# Patient Record
Sex: Female | Born: 2005 | Hispanic: Yes | Marital: Single | Smoking: Never smoker
Health system: Southern US, Community
[De-identification: ages and names within clinical notes are randomized; demographics above are authoritative.]

---
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
## 2024-02-20 ENCOUNTER — Emergency Department
Admission: EM | Admit: 2024-02-20 | Discharge: 2024-02-20 | Disposition: A | Discharge status: Home / Self Care | Attending: Emergency Medicine | Admitting: Emergency Medicine

## 2024-02-20 ENCOUNTER — Other Ambulatory Visit: Payer: Self-pay

## 2024-02-20 ENCOUNTER — Emergency Department

## 2024-02-20 DIAGNOSIS — K625 Hemorrhage of anus and rectum: Secondary | ICD-10-CM | POA: Insufficient documentation

## 2024-02-20 DIAGNOSIS — R1084 Generalized abdominal pain: Secondary | ICD-10-CM | POA: Diagnosis present

## 2024-02-20 LAB — COMPREHENSIVE METABOLIC PANEL WITH GFR
ALT: 22 U/L (ref 0–44)
AST: 20 U/L (ref 15–41)
Albumin: 4.4 g/dL (ref 3.5–5.0)
Alkaline Phosphatase: 55 U/L (ref 38–126)
Anion gap: 12 (ref 5–15)
BUN: 11 mg/dL (ref 6–20)
CO2: 21 mmol/L — ABNORMAL LOW (ref 22–32)
Calcium: 9.2 mg/dL (ref 8.9–10.3)
Chloride: 105 mmol/L (ref 98–111)
Creatinine, Ser: 0.75 mg/dL (ref 0.44–1.00)
GFR, Estimated: >60 mL/min (ref >60)
Glucose, Bld: 101 mg/dL — ABNORMAL HIGH (ref 70–99)
Potassium: 3.4 mmol/L — ABNORMAL LOW (ref 3.5–5.1)
Sodium: 138 mmol/L (ref 135–145)
Total Bilirubin: 0.9 mg/dL (ref 0.0–1.2)
Total Protein: 7.8 g/dL (ref 6.5–8.1)

## 2024-02-20 LAB — CBC
HCT: 34.2 % — ABNORMAL LOW (ref 36.0–46.0)
Hemoglobin: 11.3 g/dL — ABNORMAL LOW (ref 12.0–15.0)
MCH: 29.8 pg (ref 26.0–34.0)
MCHC: 33 g/dL (ref 30.0–36.0)
MCV: 90.2 fL (ref 80.0–100.0)
Platelets: 450 10*3/uL — ABNORMAL HIGH (ref 150–400)
RBC: 3.79 MIL/uL — ABNORMAL LOW (ref 3.87–5.11)
RDW: 14.4 % (ref 11.5–15.5)
WBC: 6.7 10*3/uL (ref 4.0–10.5)
nRBC: 0 % (ref 0.0–0.2)

## 2024-02-20 LAB — LIPASE, BLOOD: Lipase: 26 U/L (ref 11–51)

## 2024-02-20 LAB — URINALYSIS, ROUTINE W REFLEX MICROSCOPIC
Bilirubin Urine: NEGATIVE
Glucose, UA: NEGATIVE mg/dL
Ketones, ur: 20 mg/dL — AB
Leukocytes,Ua: NEGATIVE
Nitrite: NEGATIVE
Protein, ur: 30 mg/dL — AB
Specific Gravity, Urine: 1.03 (ref 1.005–1.030)
pH: 5 (ref 5.0–8.0)

## 2024-02-20 LAB — SAMPLE TO BLOOD BANK

## 2024-02-20 LAB — PREGNANCY, URINE: Preg Test, Ur: NEGATIVE

## 2024-02-20 MED ORDER — IOHEXOL 300 MG/ML  SOLN
80.0000 mL | Freq: Once | INTRAMUSCULAR | Status: AC | PRN
  Administered 2024-02-20: 80 mL via INTRAVENOUS

## 2024-02-20 NOTE — ED Provider Notes (Signed)
 [facility] Provider Note  Patient Contact: 6:23 PM (approximate)   History   Rectal Bleeding   HPI  Doris Wright is a 18 y.o. female who presents to the emergency department complaining of mild diffuse abdominal pain with blood in her stool.  Patient states that while she was at work today she had some abdominal cramping and felt like she needed to use the restroom.  Patient states that she went 3 times while at work and had blood in her stool every time.  Patient has no history of ulcerative colitis, Crohn's, irritable bowel, celiac's.  Patient denies any fever, emesis.  Denies any history of same in the past.     Physical Exam   Triage Vital Signs: ED Triage Vitals  Encounter Vitals Group     BP 02/20/24 1432 (!) 157/84     Systolic BP Percentile --      Diastolic BP Percentile --      Pulse Rate 02/20/24 1432 99     Resp 02/20/24 1432 17     Temp 02/20/24 1432 98.4 F (36.9 C)     Temp Source 02/20/24 1432 Oral     SpO2 02/20/24 1432 97 %     Weight 02/20/24 1600 231 lb 0.7 oz (104.8 kg)     Height 02/20/24 1600 5\' 5"  (1.651 m)     Head Circumference --      Peak Flow --      Pain Score 02/20/24 1421 3     Pain Loc --      Pain Education --      Exclude from Growth Chart --     Most recent vital signs: Vitals:   02/20/24 1432 02/20/24 1832  BP: (!) 157/84 (!) 150/80  Pulse: 99 88  Resp: 17 16  Temp: 98.4 F (36.9 C) 98 F (36.7 C)  SpO2: 97% 98%     General: Alert and in no acute distress.  Cardiovascular:  Good peripheral perfusion Respiratory: Normal respiratory effort without tachypnea or retractions. Lungs CTAB.  Gastrointestinal: Bowel sounds 4 quadrants.  Soft to palpation.  Mildly diffusely tender to palpation without point specific tenderness.  No palpable abnormalities.. No guarding or rigidity. No palpable masses. No distention. No CVA tenderness. Musculoskeletal: Full range of motion to all extremities.  Neurologic:   No gross focal neurologic deficits are appreciated.  Skin:   No rash noted Other:   ED Results / Procedures / Treatments   Labs (all labs ordered are listed, but only abnormal results are displayed) Labs Reviewed  COMPREHENSIVE METABOLIC PANEL WITH GFR - Abnormal; Notable for the following components:      Result Value   Potassium 3.4 (*)    CO2 21 (*)    Glucose, Bld 101 (*)    All other components within normal limits  CBC - Abnormal; Notable for the following components:   RBC 3.79 (*)    Hemoglobin 11.3 (*)    HCT 34.2 (*)    Platelets 450 (*)    All other components within normal limits  URINALYSIS, ROUTINE W REFLEX MICROSCOPIC - Abnormal; Notable for the following components:   Color, Urine YELLOW (*)    APPearance HAZY (*)    Hgb urine dipstick SMALL (*)    Ketones, ur 20 (*)    Protein, ur 30 (*)    Bacteria, UA RARE (*)    All other components within normal limits  LIPASE, BLOOD  PREGNANCY, URINE  SAMPLE TO  BLOOD BANK     EKG     RADIOLOGY  I personally viewed, evaluated, and interpreted these images as part of my medical decision making, as well as reviewing the written report by the radiologist.  ED Provider Interpretation: No acute abnormality on CT scan of the abdomen pelvis  CT ABDOMEN PELVIS W CONTRAST Result Date: 02/20/2024 CLINICAL DATA:  Acute nonlocalized abdominal pain. Blood in stool. Rectal bleeding starting this morning. Cramping. EXAM: CT ABDOMEN AND PELVIS WITH CONTRAST TECHNIQUE: Multidetector CT imaging of the abdomen and pelvis was performed using the standard protocol following bolus administration of intravenous contrast. RADIATION DOSE REDUCTION: This exam was performed according to the departmental dose-optimization program which includes automated exposure control, adjustment of the mA and/or kV according to patient size and/or use of iterative reconstruction technique. CONTRAST:  80mL OMNIPAQUE  IOHEXOL  300 MG/ML  SOLN COMPARISON:   07/29/2019 FINDINGS: Lower chest: Lung bases are clear. Hepatobiliary: Mild diffuse fatty infiltration of the liver. No focal liver lesions. Gallbladder and bile ducts are normal. Pancreas: Unremarkable. No pancreatic ductal dilatation or surrounding inflammatory changes. Spleen: Normal in size without focal abnormality. Adrenals/Urinary Tract: Adrenal glands are unremarkable. Kidneys are normal, without renal calculi, focal lesion, or hydronephrosis. Bladder is unremarkable. Stomach/Bowel: Stomach, small bowel, and colon are not abnormally distended. No wall thickening or inflammatory changes. Appendix is normal. Vascular/Lymphatic: No significant vascular findings are present. No enlarged abdominal or pelvic lymph nodes. Reproductive: Uterus and bilateral adnexa are unremarkable. Small amount of free fluid in the pelvis is likely physiologic. Other: No free air in the abdomen. Abdominal wall musculature appears intact. Musculoskeletal: No acute or significant osseous findings. IMPRESSION: 1. Mild diffuse fatty infiltration of the liver. 2. Small amount of free fluid in the pelvis is likely physiologic. 3. No acute process demonstrated. Electronically Signed   By: Boyce Byes M.D.   On: 02/20/2024 19:11    PROCEDURES:  Critical Care performed: No  Procedures   MEDICATIONS ORDERED IN ED: Medications  iohexol  (OMNIPAQUE ) 300 MG/ML solution 80 mL (80 mLs Intravenous Contrast Given 02/20/24 1850)     IMPRESSION / MDM / ASSESSMENT AND PLAN / ED COURSE  I reviewed the triage vital signs and the nursing notes.                                 Differential diagnosis includes, but is not limited to, GI bleed, hemorrhoid, vaginal, hematuria, Crohn's, ulcerative colitis, diverticulitis   Patient's presentation is most consistent with acute presentation with potential threat to life or bodily function.   Patient's diagnosis is consistent with GI bleeding.  Patient presents to the emergency  department with some mild blood in her stool starting today.  She had some abdominal cramping today but no fevers, emesis, diarrhea or constipation.  Patient has no rectal pain.  Patient did have a picture of the blood and there is some blood in her stool.  Labs reveal reassuring blood counts, no significant tenderness on physical exam and CT scan reveals no acute findings at this time.  After lengthy discussion with the patient we have talked about dietary items that can cause GI irritation.  Recommend bland diet over the next several days.  I wrote him in follow-up with GI.  If symptoms are worsening patient may return to the emergency department for reevaluation.  Patient is given ED precautions to return to the ED for any worsening or new symptoms.  FINAL CLINICAL IMPRESSION(S) / ED DIAGNOSES   Final diagnoses:  Rectal bleeding     Rx / DC Orders   ED Discharge Orders     None        Note:  This document was prepared using Dragon voice recognition software and may include unintentional dictation errors.   Annia Kilts 02/20/24 1949    Jacquie Maudlin, MD 02/21/24 2006

## 2024-02-20 NOTE — ED Notes (Signed)
 See triage note  Presents with some rectal bleeding  States she had 3 episodes of bright red blood while at work today

## 2024-02-20 NOTE — ED Notes (Signed)
Pt back from CT, nadn

## 2024-02-20 NOTE — ED Triage Notes (Signed)
 Pt comes with c/o rectal bleeding pt states this started this morning. Pt states bright red. Pt states some cramping.
